# Patient Record
Sex: Female | Born: 2000 | Race: Black or African American | Hispanic: No | Marital: Single | Smoking: Never smoker
Health system: Southern US, Community
[De-identification: ages and names within clinical notes are randomized; demographics above are authoritative.]

## PROBLEM LIST (undated history)

## (undated) DIAGNOSIS — Z789 Other specified health status: Secondary | ICD-10-CM

## (undated) HISTORY — PX: NO PAST SURGERIES: SHX2092

---

## 2021-07-03 ENCOUNTER — Inpatient Hospital Stay (HOSPITAL_COMMUNITY): Payer: Managed Care, Other (non HMO)

## 2021-07-03 ENCOUNTER — Inpatient Hospital Stay (HOSPITAL_COMMUNITY)
Admission: AD | Admit: 2021-07-03 | Discharge: 2021-07-03 | Disposition: A | Discharge status: Home / Self Care | Payer: Managed Care, Other (non HMO) | Attending: Obstetrics & Gynecology | Admitting: Obstetrics & Gynecology

## 2021-07-03 ENCOUNTER — Encounter (HOSPITAL_COMMUNITY): Payer: Self-pay | Admitting: Obstetrics & Gynecology

## 2021-07-03 DIAGNOSIS — R109 Unspecified abdominal pain: Secondary | ICD-10-CM | POA: Diagnosis not present

## 2021-07-03 DIAGNOSIS — O209 Hemorrhage in early pregnancy, unspecified: Secondary | ICD-10-CM

## 2021-07-03 DIAGNOSIS — O26891 Other specified pregnancy related conditions, first trimester: Secondary | ICD-10-CM | POA: Insufficient documentation

## 2021-07-03 DIAGNOSIS — O3680X0 Pregnancy with inconclusive fetal viability, not applicable or unspecified: Secondary | ICD-10-CM | POA: Insufficient documentation

## 2021-07-03 DIAGNOSIS — O200 Threatened abortion: Secondary | ICD-10-CM | POA: Diagnosis not present

## 2021-07-03 DIAGNOSIS — Z3A01 Less than 8 weeks gestation of pregnancy: Secondary | ICD-10-CM | POA: Insufficient documentation

## 2021-07-03 HISTORY — DX: Other specified health status: Z78.9

## 2021-07-03 LAB — CBC
HCT: 36.7 % (ref 36.0–46.0)
Hemoglobin: 11.4 g/dL — ABNORMAL LOW (ref 12.0–15.0)
MCH: 26.6 pg (ref 26.0–34.0)
MCHC: 31.1 g/dL (ref 30.0–36.0)
MCV: 85.7 fL (ref 80.0–100.0)
Platelets: 360 10*3/uL (ref 150–400)
RBC: 4.28 MIL/uL (ref 3.87–5.11)
RDW: 12.1 % (ref 11.5–15.5)
WBC: 7.9 10*3/uL (ref 4.0–10.5)
nRBC: 0 % (ref 0.0–0.2)

## 2021-07-03 LAB — URINALYSIS, ROUTINE W REFLEX MICROSCOPIC
Bacteria, UA: NONE SEEN
Bilirubin Urine: NEGATIVE
Glucose, UA: NEGATIVE mg/dL
Ketones, ur: 5 mg/dL — AB
Leukocytes,Ua: NEGATIVE
Nitrite: NEGATIVE
Protein, ur: NEGATIVE mg/dL
Specific Gravity, Urine: 1.021 (ref 1.005–1.030)
pH: 5 (ref 5.0–8.0)

## 2021-07-03 LAB — WET PREP, GENITAL
Clue Cells Wet Prep HPF POC: NONE SEEN
Sperm: NONE SEEN
Trich, Wet Prep: NONE SEEN
Yeast Wet Prep HPF POC: NONE SEEN

## 2021-07-03 LAB — POCT PREGNANCY, URINE: Preg Test, Ur: POSITIVE — AB

## 2021-07-03 LAB — HCG, QUANTITATIVE, PREGNANCY: hCG, Beta Chain, Quant, S: 1998 m[IU]/mL — ABNORMAL HIGH (ref <5)

## 2021-07-03 NOTE — MAU Provider Note (Signed)
History     CSN: [id number]  Arrival date and time: 07/03/21 1509   Event Date/Time   First Provider Initiated Contact with Patient 07/03/21 1553      Chief Complaint  Patient presents with  . Vaginal Bleeding  . Abdominal Pain  . Possible Pregnancy   HPI Carmen Cook is a 20 y.o. G1P0 at [redacted]w[redacted]d who presents with vaginal bleeding & abdominal cramping. Symptoms started this morning. Was initially brown spotting but is now more red & bleeding into a pad. Passing some small clots. Rates abdominal cramping 6/10. Has been taking tylenol with minimal relief. Pain is throughout her lower abdomen. Does not have an ob/gyn. Denies abnormal discharge, n/v/d, fever, dysuria.   OB History     Gravida  1   Para      Term      Preterm      AB      Living         SAB      IAB      Ectopic      Multiple      Live Births              Past Medical History:  Diagnosis Date  . No pertinent past medical history     Past Surgical History:  Procedure Laterality Date  . NO PAST SURGERIES      No family history on file.     Allergies: Not on File  No medications prior to admission.    Review of Systems  Constitutional: Negative.   Gastrointestinal:  Positive for abdominal pain.  Genitourinary:  Positive for vaginal bleeding.  Physical Exam   Blood pressure 117/69, pulse 80, temperature 98.5 F (36.9 C), temperature source Oral, resp. rate 17, height 5\' 6"  (1.676 m), weight 66 kg, last menstrual period 05/16/2021, SpO2 100 %.  Physical Exam Vitals and nursing note reviewed.  Constitutional:      General: She is not in acute distress.    Appearance: She is well-developed.  HENT:     Head: Normocephalic and atraumatic.  Eyes:     General: No scleral icterus. Pulmonary:     Effort: Pulmonary effort is normal. No respiratory distress.  Abdominal:     General: Abdomen is flat.     Palpations: Abdomen is soft.     Tenderness: There is no abdominal  tenderness.  Skin:    General: Skin is warm and dry.  Neurological:     General: No focal deficit present.     Mental Status: She is alert.  Psychiatric:        Mood and Affect: Mood normal.        Behavior: Behavior normal.    MAU Course  Procedures Results for orders placed or performed during the hospital encounter of 07/03/21 (from the past 24 hour(s))  Pregnancy, urine POC     Status: Abnormal   Collection Time: 07/03/21  3:48 PM  Result Value Ref Range   Preg Test, Ur POSITIVE (A) NEGATIVE  Urinalysis, Routine w reflex microscopic Urine, Clean Catch     Status: Abnormal   Collection Time: 07/03/21  3:51 PM  Result Value Ref Range   Color, Urine YELLOW YELLOW   APPearance HAZY (A) CLEAR   Specific Gravity, Urine 1.021 1.005 - 1.030   pH 5.0 5.0 - 8.0   Glucose, UA NEGATIVE NEGATIVE mg/dL   Hgb urine dipstick LARGE (A) NEGATIVE   Bilirubin Urine NEGATIVE NEGATIVE   Ketones,  ur 5 (A) NEGATIVE mg/dL   Protein, ur NEGATIVE NEGATIVE mg/dL   Nitrite NEGATIVE NEGATIVE   Leukocytes,Ua NEGATIVE NEGATIVE   RBC / HPF 11-20 0 - 5 RBC/hpf   WBC, UA 0-5 0 - 5 WBC/hpf   Bacteria, UA NONE SEEN NONE SEEN   Squamous Epithelial / LPF 0-5 0 - 5   Mucus PRESENT   Wet prep, genital     Status: Abnormal   Collection Time: 07/03/21  4:02 PM   Specimen: PATH Cytology Cervicovaginal Ancillary Only  Result Value Ref Range   Yeast Wet Prep HPF POC NONE SEEN NONE SEEN   Trich, Wet Prep NONE SEEN NONE SEEN   Clue Cells Wet Prep HPF POC NONE SEEN NONE SEEN   WBC, Wet Prep HPF POC MANY (A) NONE SEEN   Sperm NONE SEEN   CBC     Status: Abnormal   Collection Time: 07/03/21  4:22 PM  Result Value Ref Range   WBC 7.9 4.0 - 10.5 K/uL   RBC 4.28 3.87 - 5.11 MIL/uL   Hemoglobin 11.4 (L) 12.0 - 15.0 g/dL   HCT 06.2 69.4 - 85.4 %   MCV 85.7 80.0 - 100.0 fL   MCH 26.6 26.0 - 34.0 pg   MCHC 31.1 30.0 - 36.0 g/dL   RDW 62.7 03.5 - 00.9 %   Platelets 360 150 - 400 K/uL   nRBC 0.0 0.0 - 0.2 %   ABO/Rh     Status: None   Collection Time: 07/03/21  4:22 PM  Result Value Ref Range   ABO/RH(D)      A POS Performed at [facility], [address], [place], [place] [address]   hCG, quantitative, pregnancy     Status: Abnormal   Collection Time: 07/03/21  4:22 PM  Result Value Ref Range   hCG, Beta Chain, Quant, S 1,998 (H) <5 mIU/mL   US OB LESS THAN 14 WEEKS WITH OB TRANSVAGINAL  Result Date: 07/03/2021 CLINICAL DATA:  20 year old female with positive urine pregnancy test and pelvic pain and vaginal bleeding for several days. EXAM: OBSTETRIC <14 WK [place] AND TRANSVAGINAL OB US TECHNIQUE: Both transabdominal and transvaginal ultrasound examinations were performed for complete evaluation of the gestation as well as the maternal uterus, adnexal regions, and pelvic cul-de-sac. Transvaginal technique was performed to assess early pregnancy. COMPARISON:  None. FINDINGS: Intrauterine gestational sac: None Yolk sac:  Not Visualized. Embryo:  Not Visualized. Maternal uterus/adnexae: Endometrium measures 12 mm. The uterus and ovaries are unremarkable. There is no evidence of free fluid or adnexal mass. IMPRESSION: No IUP or adnexal mass visualized. Differential diagnosis includes recent spontaneous abortion, IUP too early to visualize, and occult ectopic pregnancy. Recommend close follow up of quantitative B-HCG levels, and follow up US as clinically warranted. Electronically Signed   By: Harmon Pier M.D.   On: 07/03/2021 16:57    MDM +UPT UA, wet prep, GC/chlamydia, CBC, ABO/Rh, quant hCG, and [place] today to rule out ectopic pregnancy which can be life threatening.   Patient presents with cramping & bleeding. Abdomen soft & non tender. RH positive.  Ultrasound shows no IUP or adnexal mass. HCG is 1998. Reviewed with Dr. Adrian Blackwater - pt ok for f/u HCG.   Assessment and Plan   1. Pregnancy of unknown anatomic location   2. Vaginal bleeding in pregnancy, first trimester   3. Threatened  miscarriage   4. [redacted] weeks gestation of pregnancy    -Scheduled for stat HCG on Thursday  at [facility] -reviewed SAB & ectopic precautions & reasons to return to MAU -GC/CT pending -pelvic rest  Judeth Horn 07/03/2021, 6:46 PM

## 2021-07-03 NOTE — Discharge Instructions (Signed)
Return to care  If you have heavier bleeding that soaks through more than 2 pads per hour for an hour or more If you bleed so much that you feel like you might pass out or you do pass out If you have significant abdominal pain that is not improved with Tylenol   

## 2021-07-03 NOTE — MAU Note (Signed)
+  HPT 2 wks.  Has been in pain since she found out she was preg.  Pain got worse on Sat, worst pain ever.  Started spotting on Saturday, has  increased.  Has changed pad 4 times today.  Reports LMP end of Sept, had some spotting end of Oct.

## 2021-07-04 LAB — ABO/RH: ABO/RH(D): A POS

## 2021-07-04 LAB — GC/CHLAMYDIA PROBE AMP (~~LOC~~) NOT AT ARMC
Chlamydia: NEGATIVE
Comment: NEGATIVE
Comment: NORMAL
Neisseria Gonorrhea: NEGATIVE

## 2021-07-06 ENCOUNTER — Ambulatory Visit (INDEPENDENT_AMBULATORY_CARE_PROVIDER_SITE_OTHER): Payer: Managed Care, Other (non HMO)

## 2021-07-06 ENCOUNTER — Other Ambulatory Visit: Payer: Self-pay

## 2021-07-06 VITALS — BP 122/72 | HR 76 | Ht 66.0 in | Wt 141.8 lb

## 2021-07-06 DIAGNOSIS — O209 Hemorrhage in early pregnancy, unspecified: Secondary | ICD-10-CM

## 2021-07-06 LAB — BETA HCG QUANT (REF LAB): hCG Quant: 299 m[IU]/mL

## 2021-07-06 NOTE — Progress Notes (Signed)
Catalina Antigua, MD  07/06/2021  2:08 PM EST Back to Top    Please inform patient of decreasing quant HCG consistent with miscarriage. Please have her follow up with a provider for repeat quant HCG and family planning   Thanks   Peggy   Call placed to pt.  Spoke with pt. Pt given results and recommendations per Dr Jolayne Panther. Pt verbalized understanding. Pt has follow up appt on 12/5 and encouraged pt to keep to follow up on SAB and beta quat. Pt agreeable to plan of care.  Judeth Cornfield, RN

## 2021-07-06 NOTE — Progress Notes (Signed)
Pt here today for STAT Beta from MAU follow up on 07/03/21.  Pt states having vaginal bleeding, pain or cramps as the same as Saturday when went to ER for evaluation. LMP 05/10/21. Pt states changing pad every 2 hours. Denies soaking, but enough to change the pad. Has taken OTC Tylenol 500mg  every 6 hours  for pain that helps, but doesn't resolve.  Pt advised after results come back from Beta, will be contacted via phone with plan of care per Dr . Pt verbalized understanding.    Jolayne Panther, RN

## 2021-07-24 ENCOUNTER — Ambulatory Visit (INDEPENDENT_AMBULATORY_CARE_PROVIDER_SITE_OTHER): Payer: Managed Care, Other (non HMO)

## 2021-07-24 ENCOUNTER — Other Ambulatory Visit: Payer: Self-pay

## 2021-07-24 ENCOUNTER — Other Ambulatory Visit (HOSPITAL_COMMUNITY)
Admission: RE | Admit: 2021-07-24 | Discharge: 2021-07-24 | Disposition: A | Discharge status: Home / Self Care | Payer: Managed Care, Other (non HMO) | Source: Ambulatory Visit

## 2021-07-24 VITALS — BP 113/68 | HR 94 | Wt 135.9 lb

## 2021-07-24 DIAGNOSIS — R4589 Other symptoms and signs involving emotional state: Secondary | ICD-10-CM | POA: Diagnosis not present

## 2021-07-24 DIAGNOSIS — O039 Complete or unspecified spontaneous abortion without complication: Secondary | ICD-10-CM | POA: Diagnosis not present

## 2021-07-24 DIAGNOSIS — Z5941 Food insecurity: Secondary | ICD-10-CM

## 2021-07-24 DIAGNOSIS — N898 Other specified noninflammatory disorders of vagina: Secondary | ICD-10-CM

## 2021-07-24 DIAGNOSIS — Z3202 Encounter for pregnancy test, result negative: Secondary | ICD-10-CM | POA: Diagnosis not present

## 2021-07-24 DIAGNOSIS — R102 Pelvic and perineal pain: Secondary | ICD-10-CM | POA: Diagnosis not present

## 2021-07-24 LAB — POCT PREGNANCY, URINE: Preg Test, Ur: NEGATIVE

## 2021-07-24 NOTE — Progress Notes (Signed)
   GYNECOLOGY PROGRESS NOTE  History:  20 y.o. G1P0 presents to [facility] office today for follow up after SAB. She reports intermittent LLQ pain. Pain feels similar to the time she had a cyst. Denies bleeding or urinary s/s, but does report vaginal discharge with a slight odor. Mood is overall stable but wishes to speak to someone regarding SAB. She declines contraception.   The following portions of the patient's history were reviewed and updated as appropriate: allergies, current medications, past family history, past medical history, past social history, past surgical history and problem list. She has never had a pap smear.   Health Maintenance Due  Topic Date Due   COVID-19 Vaccine (1) Never done   HPV VACCINES (1 - 2-dose series) Never done   HIV Screening  Never done   Hepatitis C Screening  Never done   TETANUS/TDAP  Never done   PAP-Cervical Cytology Screening  Never done   PAP SMEAR-Modifier  Never done     Review of Systems:  Pertinent items are noted in HPI.   Objective:  Physical Exam Last menstrual period 05/10/2021, unknown if currently breastfeeding. VS reviewed, nursing note reviewed,  Constitutional: well developed, well nourished, no distress HEENT: normocephalic CV: normal rate Pulm/chest wall: normal effort Breast Exam: deferred Abdomen: soft Neuro: alert and oriented x 3 Skin: warm, dry Psych: affect normal Pelvic exam: patient self swabbed   Assessment & Plan:  1. Food insecurity  - AMBULATORY REFERRAL TO [facility]  2. Sad mood  - Ambulatory referral to [facility]  3. Pelvic pain - LLQ pain similar to previous pain d/t ovarian cyst  - US Pelvis Complete; Future  4. Discharge from the vagina - Patient self swabbed  - Cervicovaginal ancillary only( [place])  5. Miscarriage - UPT negative today - Patient was offered pap smear today but declines. Discussed importance of getting pap smear starting at age 56 and  patient will schedule appointment for pap smear next month - Patient declines contraception today    Brand Males, CNM 2:50 PM

## 2021-07-25 NOTE — BH Specialist Note (Signed)
Pt did not arrive to video visit and did not answer the phone; Left HIPPA-compliant message to call back Angelisse Riso from [facility] at  [phone] (Amariss Detamore's office).  ?; left MyChart message for patient.  ? ?

## 2021-07-27 ENCOUNTER — Other Ambulatory Visit: Payer: Self-pay

## 2021-07-27 ENCOUNTER — Ambulatory Visit
Admission: RE | Admit: 2021-07-27 | Discharge: 2021-07-27 | Disposition: A | Discharge status: Home / Self Care | Payer: Managed Care, Other (non HMO) | Source: Ambulatory Visit

## 2021-07-27 DIAGNOSIS — R102 Pelvic and perineal pain: Secondary | ICD-10-CM | POA: Diagnosis present

## 2021-07-28 LAB — CERVICOVAGINAL ANCILLARY ONLY
Bacterial Vaginitis (gardnerella): POSITIVE — AB
Candida Glabrata: NEGATIVE
Candida Vaginitis: NEGATIVE
Chlamydia: NEGATIVE
Comment: NEGATIVE
Comment: NEGATIVE
Comment: NEGATIVE
Comment: NEGATIVE
Comment: NEGATIVE
Comment: NORMAL
Neisseria Gonorrhea: NEGATIVE
Trichomonas: NEGATIVE

## 2021-07-31 ENCOUNTER — Telehealth: Payer: Self-pay

## 2021-07-31 MED ORDER — METRONIDAZOLE 500 MG PO TABS: 500.0000 mg | ORAL_TABLET | Freq: Two times a day (BID) | ORAL | 0 refills | Status: DC

## 2021-07-31 NOTE — Telephone Encounter (Addendum)
-----   Message from Brand Males, CNM sent at 07/28/2021  3:50 PM EST ----- Please send metrogel to patient's pharmacy. Patient has BV  Pt notified of results and treatment.    Addison Naegeli, RN  07/31/21

## 2021-08-07 ENCOUNTER — Ambulatory Visit: Payer: Managed Care, Other (non HMO) | Admitting: Clinical

## 2021-08-07 DIAGNOSIS — Z91199 Patient's noncompliance with other medical treatment and regimen due to unspecified reason: Secondary | ICD-10-CM

## 2021-09-07 ENCOUNTER — Other Ambulatory Visit (HOSPITAL_COMMUNITY)
Admission: RE | Admit: 2021-09-07 | Discharge: 2021-09-07 | Disposition: A | Discharge status: Home / Self Care | Payer: Managed Care, Other (non HMO) | Source: Ambulatory Visit

## 2021-09-07 ENCOUNTER — Ambulatory Visit (INDEPENDENT_AMBULATORY_CARE_PROVIDER_SITE_OTHER): Payer: Managed Care, Other (non HMO)

## 2021-09-07 ENCOUNTER — Other Ambulatory Visit: Payer: Self-pay

## 2021-09-07 VITALS — BP 112/77 | HR 89 | Ht 65.0 in | Wt 138.0 lb

## 2021-09-07 DIAGNOSIS — Z124 Encounter for screening for malignant neoplasm of cervix: Secondary | ICD-10-CM | POA: Diagnosis present

## 2021-09-07 DIAGNOSIS — N898 Other specified noninflammatory disorders of vagina: Secondary | ICD-10-CM

## 2021-09-07 DIAGNOSIS — Z01419 Encounter for gynecological examination (general) (routine) without abnormal findings: Secondary | ICD-10-CM

## 2021-09-07 DIAGNOSIS — F32A Depression, unspecified: Secondary | ICD-10-CM

## 2021-09-07 LAB — POCT PREGNANCY, URINE: Preg Test, Ur: NEGATIVE

## 2021-09-07 NOTE — Progress Notes (Signed)
Subjective:     Carmen Cook is a 21 y.o. female here at [facility] for a routine exam. Current complaints: has not had period since SAB in November. Gets cramping as if she is going to start, but never does. She also reports that she often feels sad/depressed since SAB. She has not discussed this with anyone. Denies SI. She is sexually active with 1 female partner, is not on birth control and is not interested in any. Personal health questionnaire reviewed: yes.  Do you have a primary care provider? no Do you feel safe at home? yes  [employer] Visit from 07/24/2021 in [facility] [employer] [facility]  PHQ-2 Total Score 5       Health Maintenance Due  Topic Date Due   COVID-19 Vaccine (1) Never done   HPV VACCINES (1 - 2-dose series) Never done   HIV Screening  Never done   Hepatitis C Screening  Never done   TETANUS/TDAP  Never done   PAP-Cervical Cytology Screening  Never done   PAP SMEAR-Modifier  Never done    Risk factors for chronic health problems: Smoking: No Alchohol/how much: Occasional Illicit drug use: No Exercise: No Pt BMI: Body mass index is 22.96 kg/m.   Gynecologic History Patient's last menstrual period was 05/10/2021 (exact date). Contraception: none Last Pap: n/a d/t age Last mammogram: n/a d/t age.  Obstetric History OB History  Gravida Para Term Preterm AB Living  1            SAB IAB Ectopic Multiple Live Births               # Outcome Date GA Lbr Len/2nd Weight Sex Delivery Anes PTL Lv  1 Gravida              The following portions of the patient's history were reviewed and updated as appropriate: allergies, current medications, past family history, past medical history, past social history, past surgical history, and problem list.  Review of Systems Pertinent items are noted in HPI.    Objective:   BP 112/77    Pulse 89    Ht 5\' 5"  (1.651 m)    Wt 138 lb (62.6 kg)    LMP 05/10/2021 (Exact  Date)    Breastfeeding No    BMI 22.96 kg/m  VS reviewed, nursing note reviewed,  Constitutional: well developed, well nourished, no distress HEENT: normocephalic CV: normal rate Pulm/chest wall: normal effort Breast Exam: Deferred with low risks and shared decision making, discussed recommendation to start mammogram between 40-50 yo/ exam Abdomen: soft Neuro: alert and oriented x 3 Skin: warm, dry Psych: affect normal Pelvic exam: Performed: Cervix pink, visually closed, without lesion, scant white creamy discharge, vaginal walls and external genitalia normal, pap smear obtained Bimanual exam: Cervix 0/long/high, firm, anterior, neg CMT, uterus nontender, nonenlarged, adnexa without tenderness, enlargement, or mass      Assessment/Plan:   1. Cervical cancer screening  - Cytology - PAP( [place])  2. Vaginal discharge - Requesting STD screening  - Cervicovaginal ancillary only( [place])  3. Well woman exam with routine gynecological exam - Normal well woman exam - Offered contraception, patient declines  4. Depression, unspecified depression type - Reports feeling sad/depressed since she had SAB in November; no SI - Requesting referral to [facility]  - Ambulatory referral to Integrated [facility]al [facility]    Follow up in: 1  year for well woman visit  or as  needed.   Brand Males, CNM 09/08/21 11:06 AM

## 2021-09-08 ENCOUNTER — Other Ambulatory Visit: Payer: Self-pay

## 2021-09-08 DIAGNOSIS — B3731 Acute candidiasis of vulva and vagina: Secondary | ICD-10-CM

## 2021-09-08 DIAGNOSIS — B9689 Other specified bacterial agents as the cause of diseases classified elsewhere: Secondary | ICD-10-CM

## 2021-09-08 DIAGNOSIS — N760 Acute vaginitis: Secondary | ICD-10-CM

## 2021-09-08 LAB — CYTOLOGY - PAP
Comment: NEGATIVE
Diagnosis: NEGATIVE
High risk HPV: NEGATIVE

## 2021-09-08 LAB — CERVICOVAGINAL ANCILLARY ONLY
Bacterial Vaginitis (gardnerella): POSITIVE — AB
Candida Glabrata: NEGATIVE
Candida Vaginitis: POSITIVE — AB
Chlamydia: NEGATIVE
Comment: NEGATIVE
Comment: NEGATIVE
Comment: NEGATIVE
Comment: NEGATIVE
Comment: NEGATIVE
Comment: NORMAL
Neisseria Gonorrhea: NEGATIVE
Trichomonas: NEGATIVE

## 2021-09-12 NOTE — BH Specialist Note (Signed)
Pt did not arrive to video visit and did not answer the phone; Unable to leave voice message as mailbox is full; left MyChart message for patient.   

## 2021-09-15 MED ORDER — METRONIDAZOLE 500 MG PO TABS: 500.0000 mg | ORAL_TABLET | Freq: Two times a day (BID) | ORAL | 0 refills | Status: DC

## 2021-09-15 MED ORDER — FLUCONAZOLE 150 MG PO TABS: 150.0000 mg | ORAL_TABLET | Freq: Once | ORAL | 0 refills | Status: AC

## 2021-09-25 ENCOUNTER — Other Ambulatory Visit: Payer: Self-pay

## 2021-09-25 ENCOUNTER — Ambulatory Visit: Payer: Medicaid Other | Admitting: Clinical

## 2021-09-25 ENCOUNTER — Telehealth: Payer: Self-pay | Admitting: Lactation Services

## 2021-09-25 DIAGNOSIS — Z91199 Patient's noncompliance with other medical treatment and regimen due to unspecified reason: Secondary | ICD-10-CM

## 2021-09-25 MED ORDER — METRONIDAZOLE 0.75 % VA GEL: 1.0000 | Freq: Every day | VAGINAL | 0 refills | Status: AC

## 2021-09-25 NOTE — Telephone Encounter (Signed)
Patient called in with concerns with medication. She reports she was treated with Flagyl in December and had a lot of headaches, could not stand up felt poorly while taking. She was prescribed Flagyl again in January after Vaginal swab showed + yeast and BV. She has not picked up not taken the medication as the initial course caused her to not want to take the second round. She reports she has been trying to call the office in the last week, without success.   Offered Metrogel vs asking OB for another type of ATB, patient would like to try the Metrogel. Reviewed I will send a My Chart message once sent. Informed her some insurance companies may have a higher deductible or may not cover it. Patient voiced understanding.   Reviewed taking the Metrogel and when finished take the Diflucan. Patient voiced understanding.

## 2021-10-20 ENCOUNTER — Ambulatory Visit (HOSPITAL_COMMUNITY)
Admission: EM | Admit: 2021-10-20 | Discharge: 2021-10-20 | Disposition: A | Discharge status: Home / Self Care | Payer: Managed Care, Other (non HMO) | Attending: Internal Medicine | Admitting: Internal Medicine

## 2021-10-20 ENCOUNTER — Other Ambulatory Visit: Payer: Self-pay

## 2021-10-20 ENCOUNTER — Encounter (HOSPITAL_COMMUNITY): Payer: Self-pay

## 2021-10-20 DIAGNOSIS — N760 Acute vaginitis: Secondary | ICD-10-CM | POA: Insufficient documentation

## 2021-10-20 MED ORDER — FLUCONAZOLE 150 MG PO TABS: 150.0000 mg | ORAL_TABLET | Freq: Every day | ORAL | 0 refills | Status: AC

## 2021-10-20 NOTE — ED Provider Notes (Signed)
?[facility] ? ? ? ?CSN: [id number] ?Arrival date & time: 10/20/21  1743 ? ? ?  ? ?History   ?Chief Complaint ?No chief complaint on file. ? ? ?HPI ?Carmen Cook is a 21 y.o. female to the urgent care complaining of whitish vaginal discharge of several days duration.  The discharge is thick, white and associated with itching in the vaginal area.  She has scratched to the point where she has irritation in the vaginal area.  No dysuria urgency or frequency.  Patient is sexually active with 1 sexual partner.  She engages in unprotected sexual intercourse.  She was treated for BV and vaginal yeast infection a few weeks ago but the symptoms have recurred.  She has some lower abdominal discomfort with no significant pain.  No constipation or diarrhea.  ? ?HPI ? ?Past Medical History:  ?Diagnosis Date  ? No pertinent past medical history   ? ? ?There are no problems to display for this patient. ? ? ?Past Surgical History:  ?Procedure Laterality Date  ? NO PAST SURGERIES    ? ? ?OB History   ? ? Gravida  ?1  ? Para  ?   ? Term  ?   ? Preterm  ?   ? AB  ?   ? Living  ?   ?  ? ? SAB  ?   ? IAB  ?   ? Ectopic  ?   ? Multiple  ?   ? Live Births  ?   ?   ?  ?  ? ? ? ?Home Medications   ? ?Prior to Admission medications   ?Medication Sig Start Date End Date Taking? Authorizing Provider  ?fluconazole (DIFLUCAN) 150 MG tablet Take 1 tablet (150 mg total) by mouth daily. 10/20/21  Yes Mikal Wisman, Britta Mccreedy, MD  ? ? ?Family History ?No family history on file. ? ?Social History ?Social History  ? ?Tobacco Use  ? Smoking status: Never  ? Smokeless tobacco: Never  ?Substance Use Topics  ? Alcohol use: Yes  ? Drug use: Not Currently  ? ? ? ?Allergies   ?Patient has no known allergies. ? ? ?Review of Systems ?Review of Systems  ?HENT: Negative.    ?Gastrointestinal: Negative.   ?Genitourinary:  Positive for vaginal discharge. Negative for dysuria, frequency, urgency, vaginal bleeding and vaginal pain.  ? ? ?Physical Exam ?Triage  Vital Signs ?ED Triage Vitals  ?Enc Vitals Group  ?   BP 10/20/21 1841 110/74  ?   Pulse Rate 10/20/21 1841 89  ?   Resp 10/20/21 1841 16  ?   Temp 10/20/21 1841 98 ?F (36.7 ?C)  ?   Temp Source 10/20/21 1841 Oral  ?   SpO2 10/20/21 1841 100 %  ?   Weight --   ?   Height --   ?   Head Circumference --   ?   Peak Flow --   ?   Pain Score 10/20/21 1846 0  ?   Pain Loc --   ?   Pain Edu? --   ?   Excl. in GC? --   ? ?No data found. ? ?Updated Vital Signs ?BP 110/74 (BP Location: Left Arm)   Pulse 89   Temp 98 ?F (36.7 ?C) (Oral)   Resp 16   LMP 09/08/2021 (Approximate)   SpO2 100%   Breastfeeding No  ? ?Visual Acuity ?Right Eye Distance:   ?Left Eye Distance:   ?Bilateral Distance:   ? ?  Right Eye Near:   ?Left Eye Near:    ?Bilateral Near:    ? ?Physical Exam ?Vitals reviewed.  ?Cardiovascular:  ?   Rate and Rhythm: Normal rate and regular rhythm.  ?Pulmonary:  ?   Effort: Pulmonary effort is normal.  ?   Breath sounds: Normal breath sounds.  ?Abdominal:  ?   General: Bowel sounds are normal.  ?   Palpations: Abdomen is soft.  ?Musculoskeletal:     ?   General: Normal range of motion.  ?Neurological:  ?   Mental Status: She is alert.  ? ? ? ?UC Treatments / Results  ?Labs ?(all labs ordered are listed, but only abnormal results are displayed) ?Labs Reviewed  ?CERVICOVAGINAL ANCILLARY ONLY  ? ? ?EKG ? ? ?Radiology ?No results found. ? ?Procedures ?Procedures (including critical care time) ? ?Medications Ordered in UC ?Medications - No data to display ? ?Initial Impression / Assessment and Plan / UC Course  ?I have reviewed the triage vital signs and the nursing notes. ? ?Pertinent labs & imaging results that were available during my care of the patient were reviewed by me and considered in my medical decision making (see chart for details). ? ?  ? ?1.  Acute vaginitis: ?Cervical vaginal swab for GC/chlamydia/trichomonas/bacterial vaginosis/vaginal yeast ?Fluconazole 150 mg x 1 dose to be repeated in 72 hours if  symptoms does not improve ?We will call patient with recommendations if labs are abnormal ?Return precautions given. ?Final Clinical Impressions(s) / UC Diagnoses  ? ?Final diagnoses:  ?Acute vaginitis  ? ? ? ?Discharge Instructions   ? ?  ?We will call you with recommendations if labs are abnormal ?Take medications as prescribed ?If symptoms recur please return to urgent care to be reevaluated ?Avoid sexual intercourse until lab results are available. ? ? ? ? ?ED Prescriptions   ? ? Medication Sig Dispense Auth. Provider  ? fluconazole (DIFLUCAN) 150 MG tablet Take 1 tablet (150 mg total) by mouth daily. 2 tablet Evona Westra, Britta Mccreedy, MD  ? ?  ? ?PDMP not reviewed this encounter. ?  ?Merrilee Jansky, MD ?10/20/21 1911 ? ?

## 2021-10-20 NOTE — Discharge Instructions (Addendum)
We will call you with recommendations if labs are abnormal ?Take medications as prescribed ?If symptoms recur please return to urgent care to be reevaluated ?Avoid sexual intercourse until lab results are available. ?

## 2021-10-20 NOTE — ED Triage Notes (Signed)
Pt reports vaginal ongoing vaginal discharge. She thinks she has BV, but would like to be check for all STI. ?

## 2021-10-23 LAB — CERVICOVAGINAL ANCILLARY ONLY
Bacterial Vaginitis (gardnerella): NEGATIVE
Candida Glabrata: NEGATIVE
Candida Vaginitis: POSITIVE — AB
Chlamydia: NEGATIVE
Comment: NEGATIVE
Comment: NEGATIVE
Comment: NEGATIVE
Comment: NEGATIVE
Comment: NEGATIVE
Comment: NORMAL
Neisseria Gonorrhea: NEGATIVE
Trichomonas: NEGATIVE

## 2023-03-25 IMAGING — US US OB < 14 WEEKS - US OB TV
1 series · 15 of 28 positions shown · non-contrast
Comparison: None.

CLINICAL DATA: 20-year-old female with positive urine pregnancy
test and pelvic pain and vaginal bleeding for several days.

EXAM:
OBSTETRIC <14 WK US AND TRANSVAGINAL OB US
TECHNIQUE: Both transabdominal and transvaginal ultrasound examinations were
performed for complete evaluation of the gestation as well as the
maternal uterus, adnexal regions, and pelvic cul-de-sac.
Transvaginal technique was performed to assess early pregnancy.

[Series 1: us ob < 14 weeks - us ob tv · 15 of 50 slices shown]
[im 1/50]
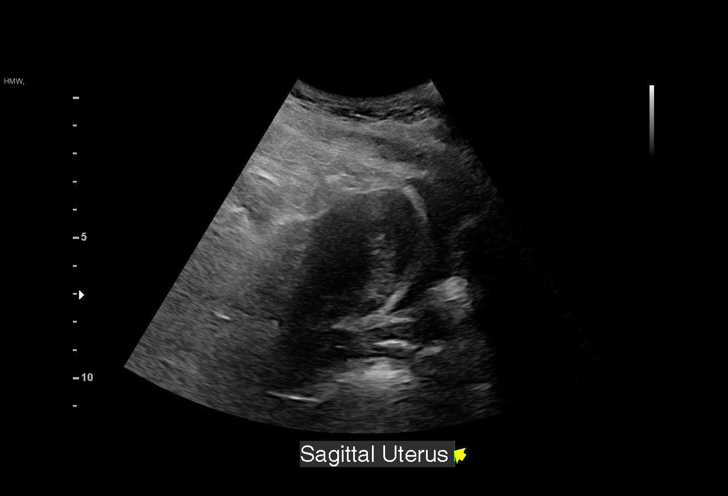
[im 4/50]
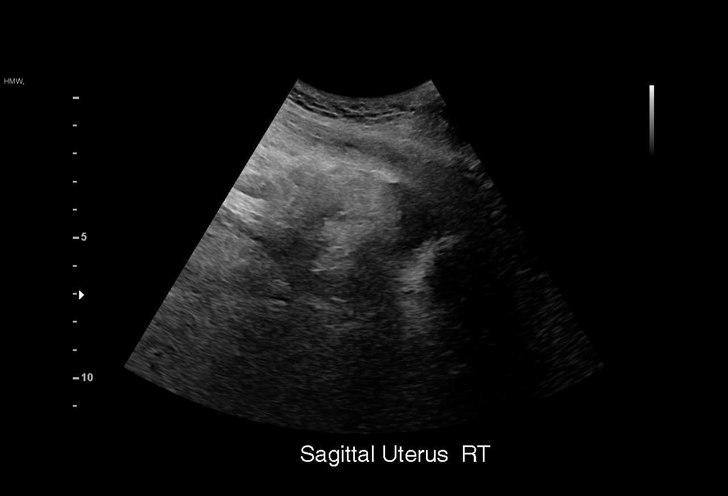
[im 8/50]
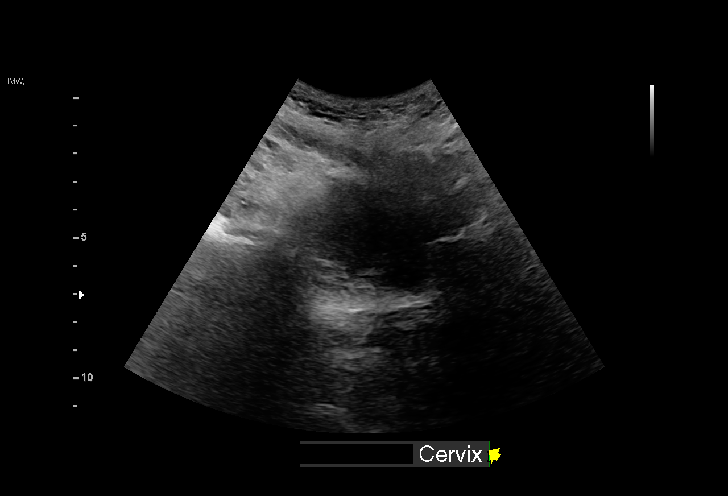
[im 11/50]
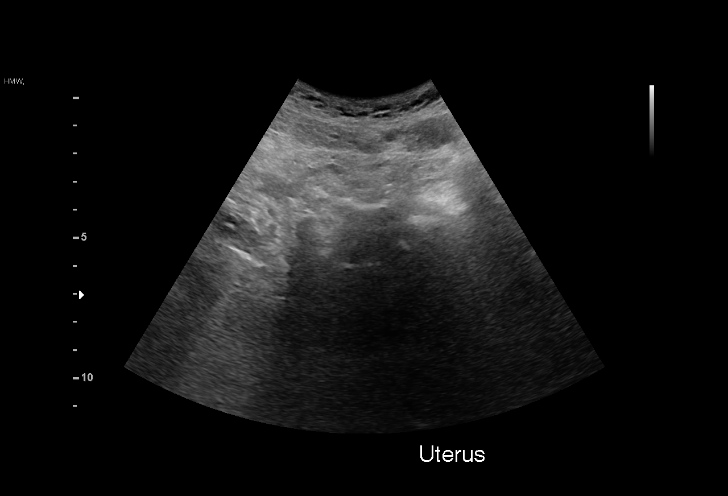
[im 15/50]
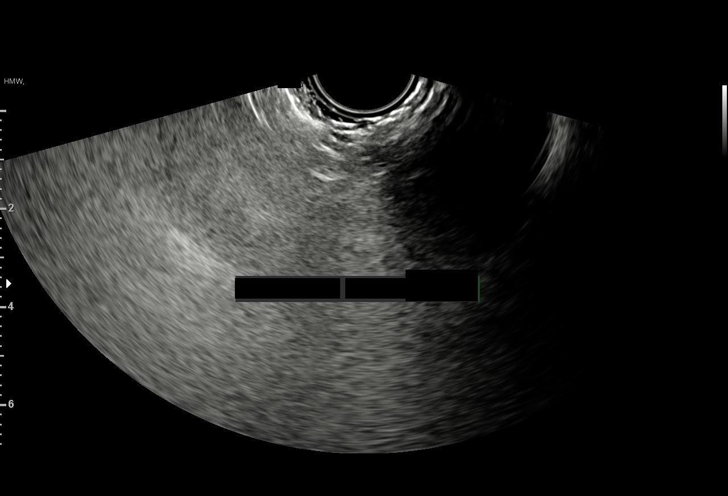
[im 19/50]
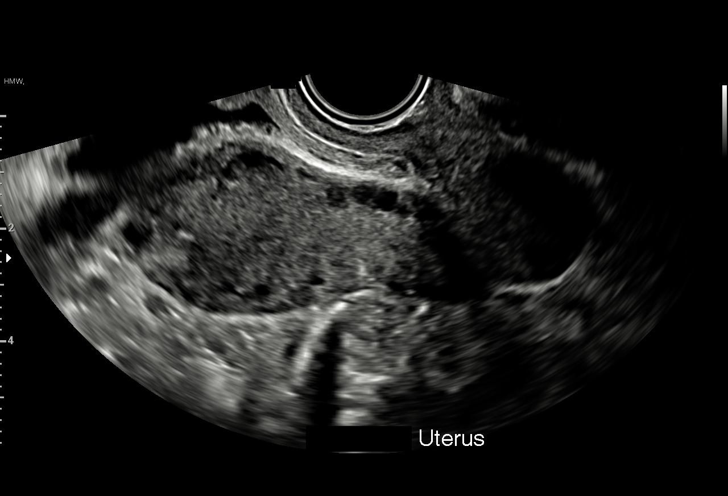
[im 22/50]
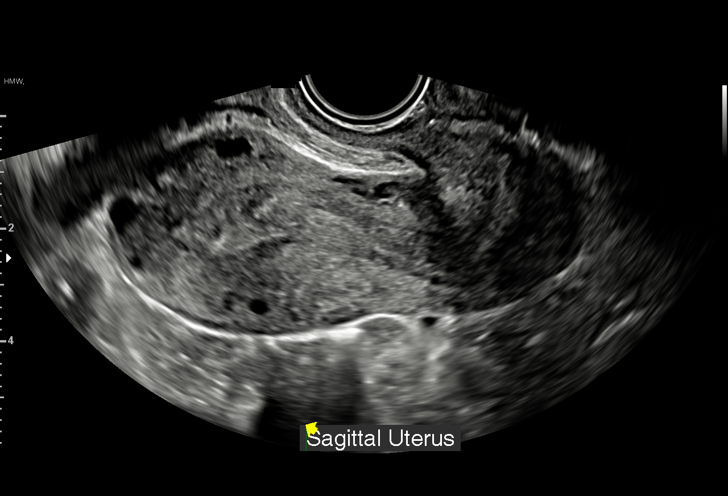
[im 26/50]
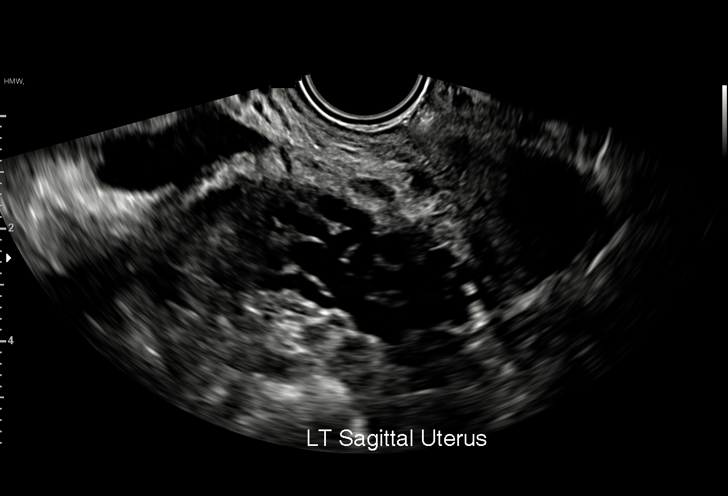
[im 28/50]
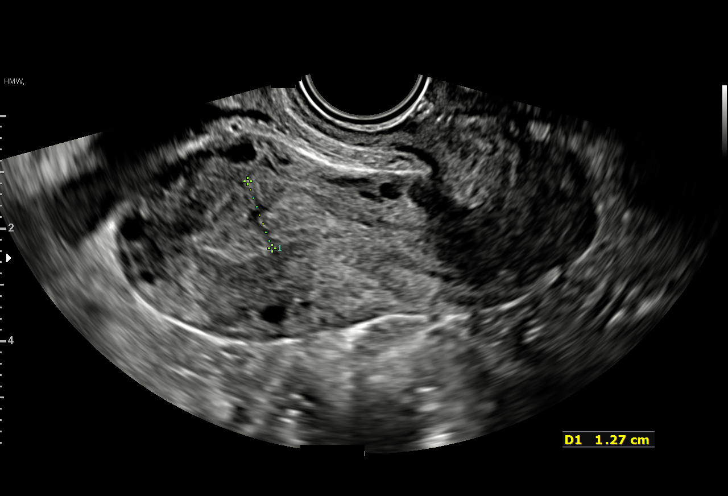
[im 31/50]
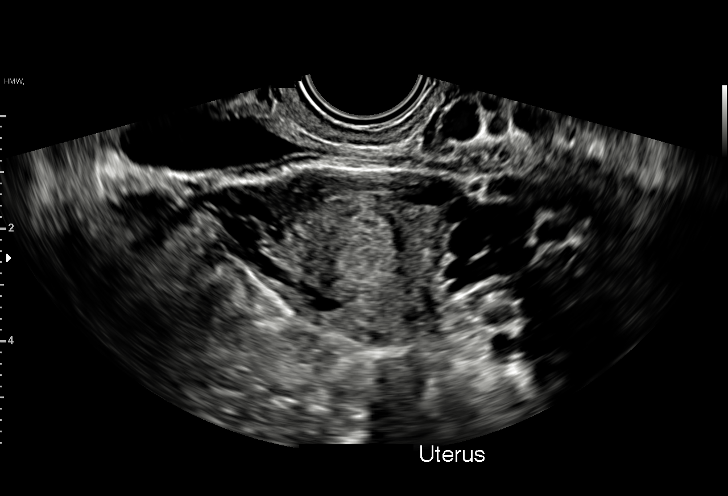
[im 35/50]
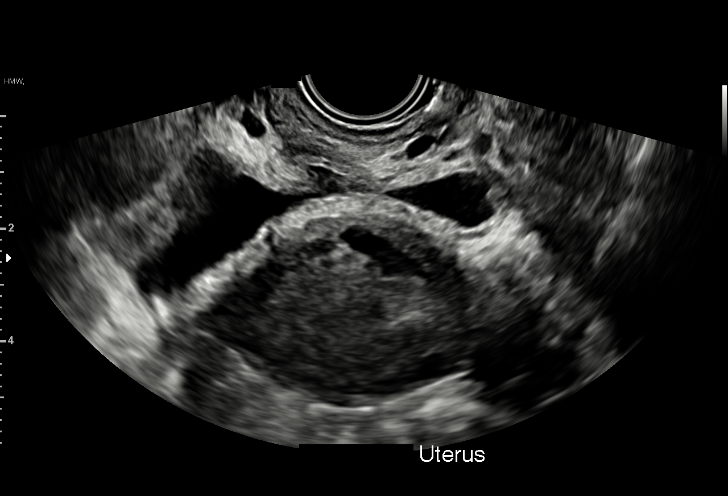
[im 39/50]
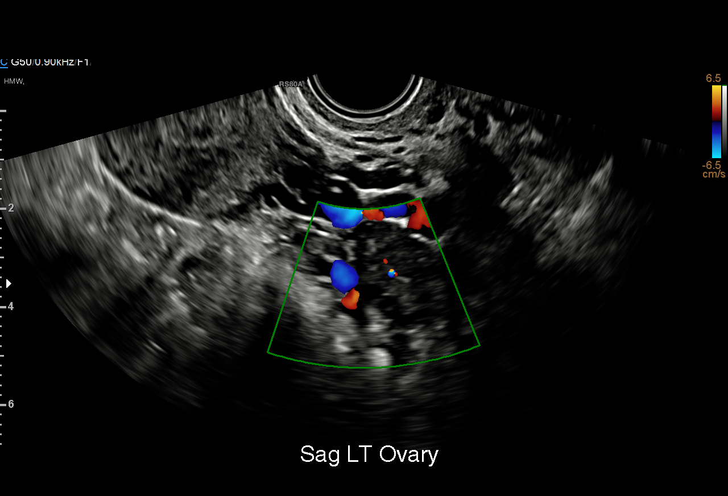
[im 42/50]
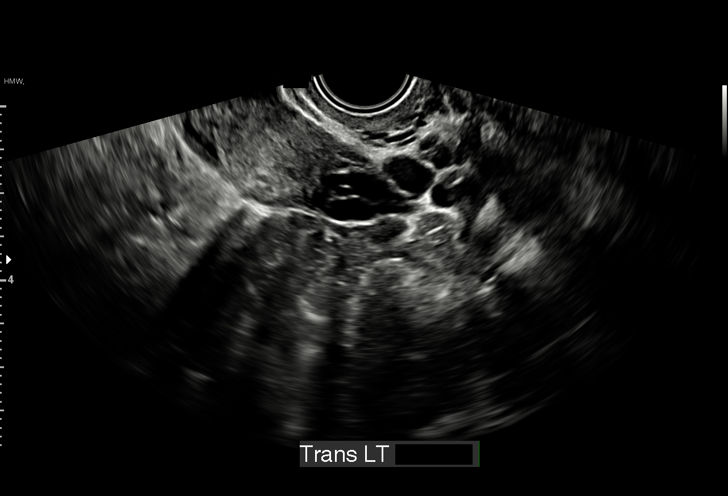
[im 46/50]
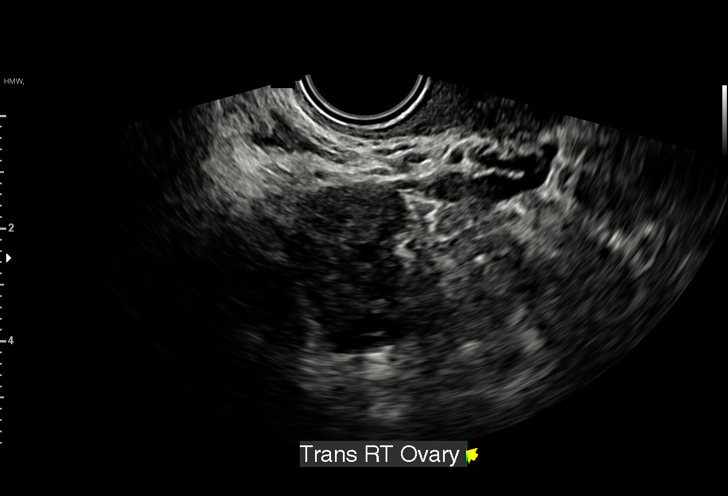
[im 50/50]
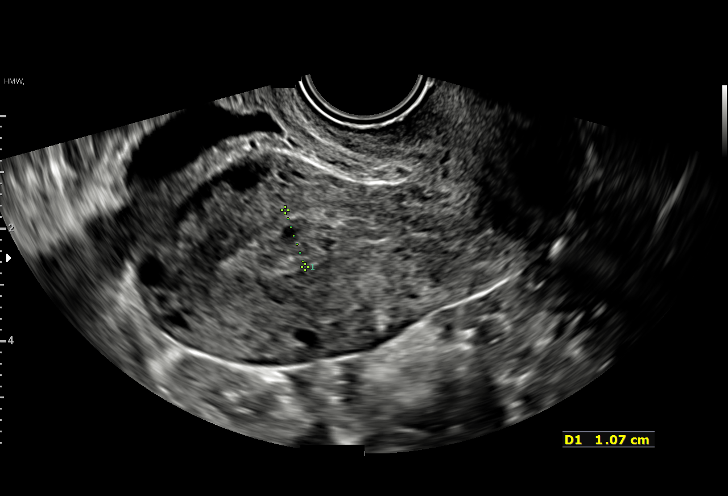

[15 of 28 positions shown; findings below may reference images not displayed]

FINDINGS: Intrauterine gestational sac: None

Yolk sac:  Not Visualized.

Embryo:  Not Visualized.

Maternal uterus/adnexae: Endometrium measures 12 mm.

The uterus and ovaries are unremarkable.

There is no evidence of free fluid or adnexal mass.
IMPRESSION: No IUP or adnexal mass visualized. Differential diagnosis includes
recent spontaneous abortion, IUP too early to visualize, and occult
ectopic pregnancy. Recommend close follow up of quantitative B-HCG
levels, and follow up US as clinically warranted.

## 2023-04-18 IMAGING — US US PELVIS COMPLETE
1 series · 15 of 22 positions shown · non-contrast
Comparison: Prior ultrasound from 07/03/2021

CLINICAL DATA: Initial evaluation for left-sided pelvic pain,
history of SAB in [REDACTED].

EXAM:
TRANSABDOMINAL ULTRASOUND OF PELVIS
TECHNIQUE: Transabdominal ultrasound examination of the pelvis was performed
including evaluation of the uterus, ovaries, adnexal regions, and
pelvic cul-de-sac.

[Series 1: us pelvis complete · 15 of 22 slices shown]
[im 1/22]
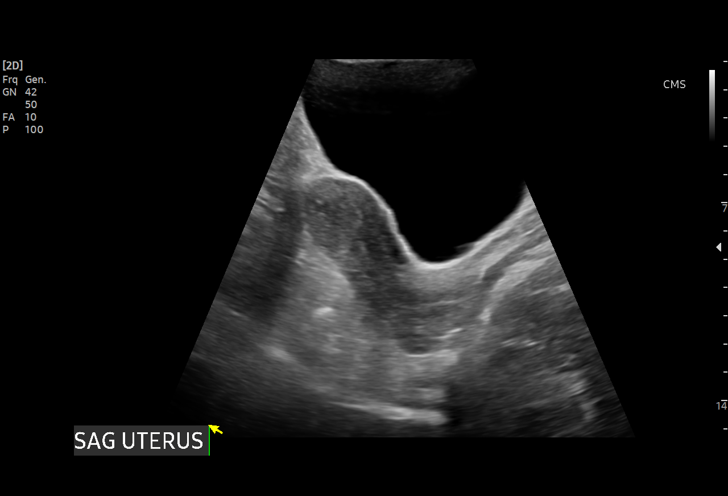
[im 3/22]
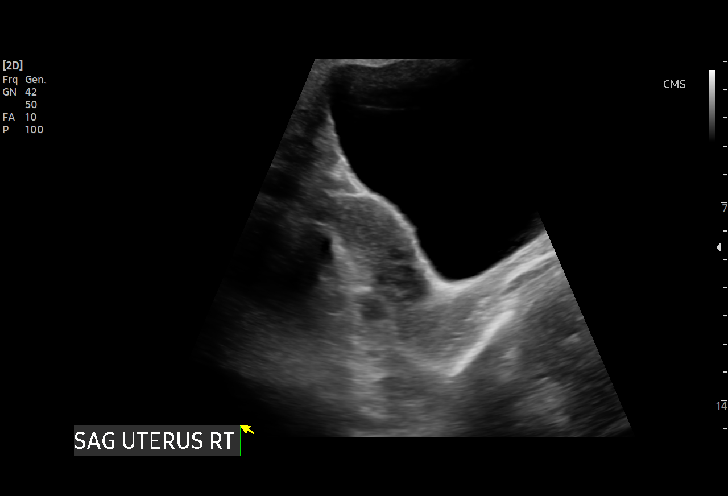
[im 4/22]
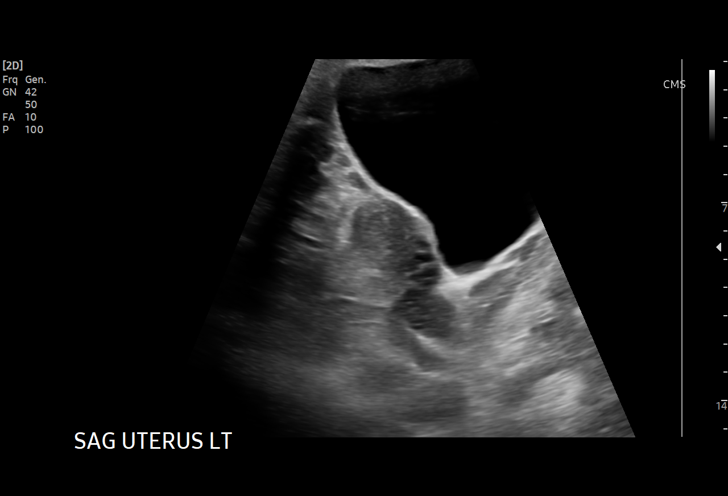
[im 6/22]
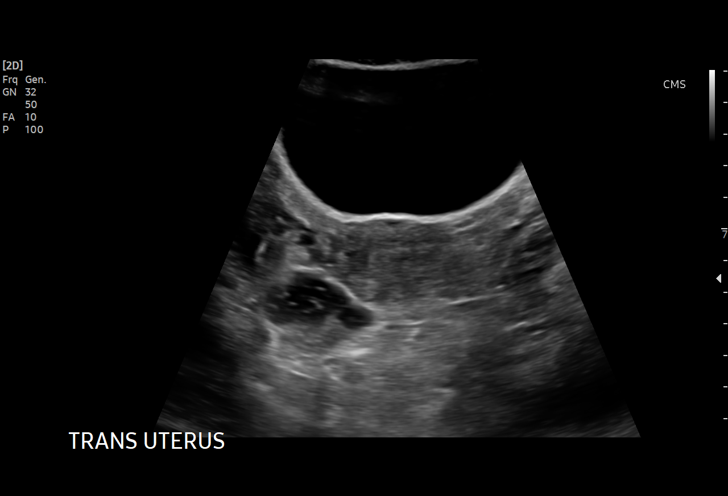
[im 7/22]
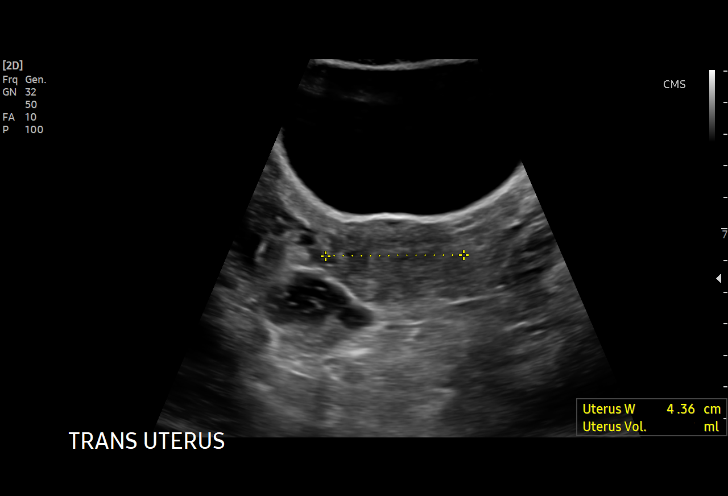
[im 9/22]
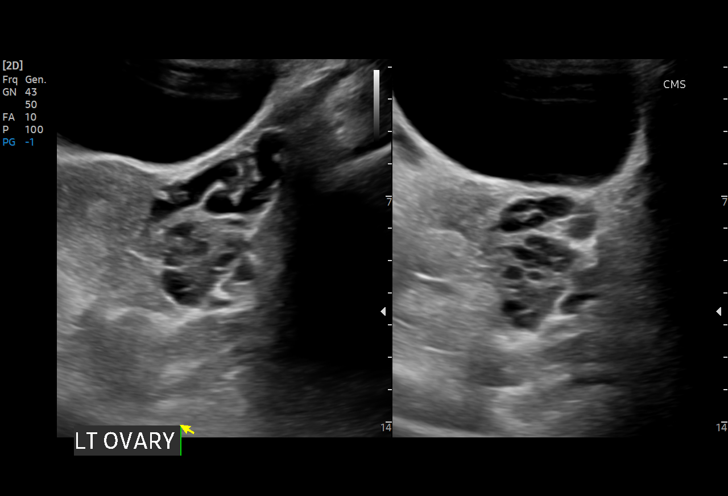
[im 10/22]
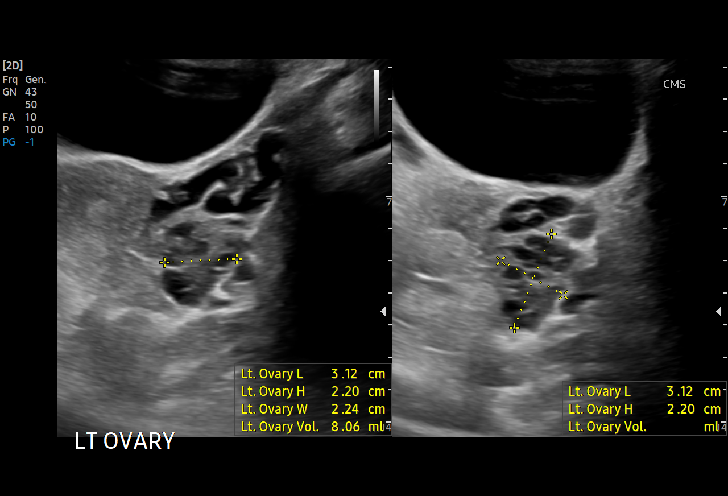
[im 12/22]
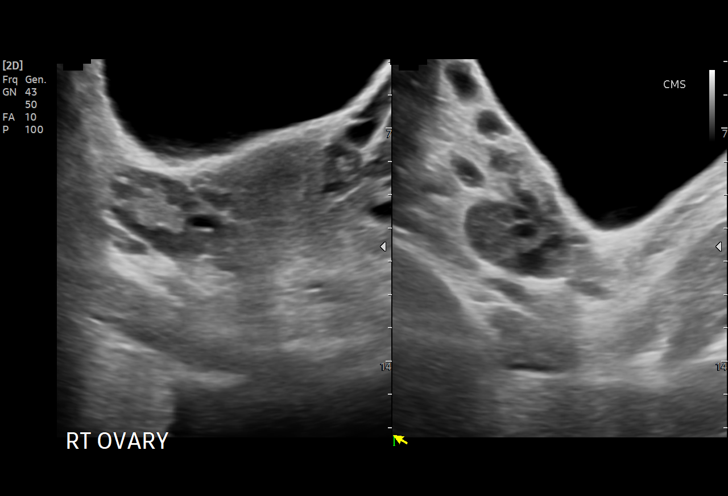
[im 13/22]
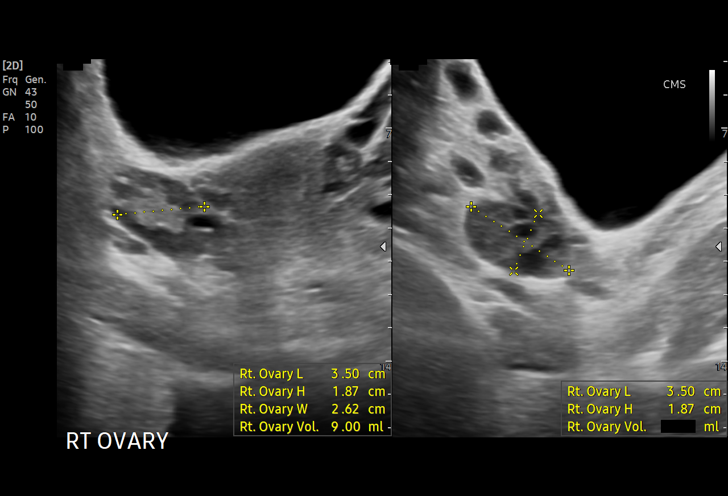
[im 14/22]
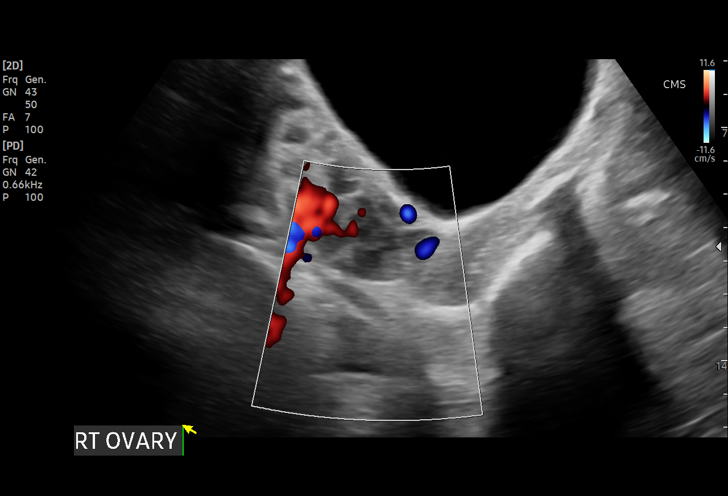
[im 16/22]
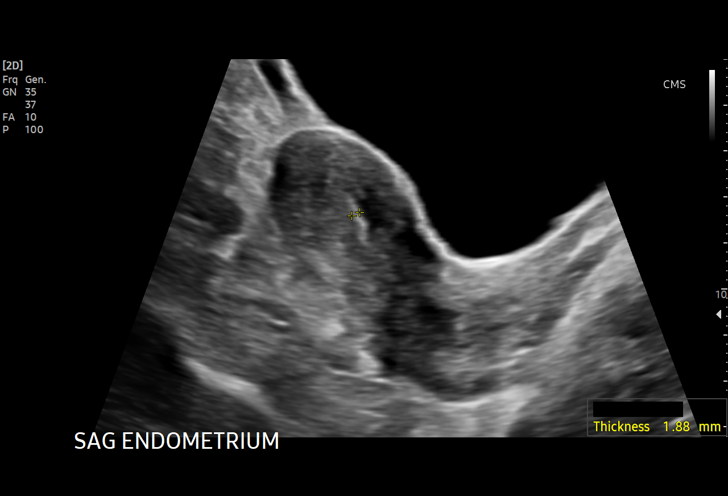
[im 17/22]
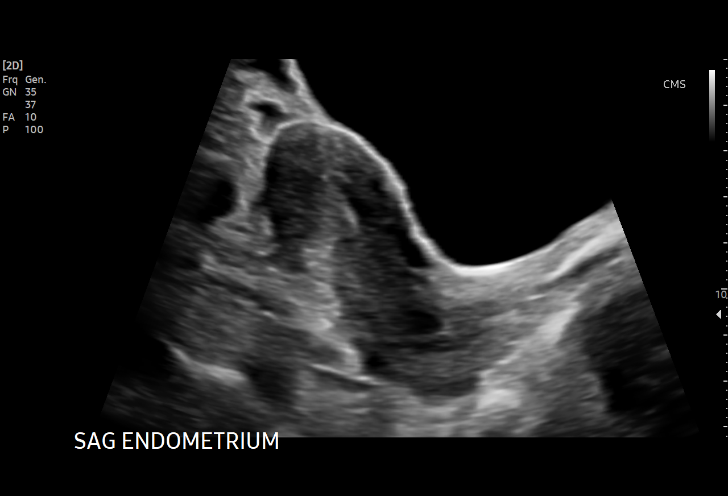
[im 19/22]
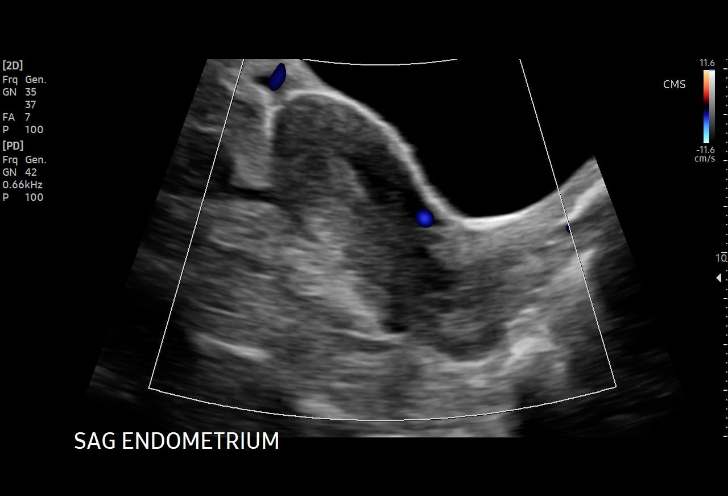
[im 20/22]
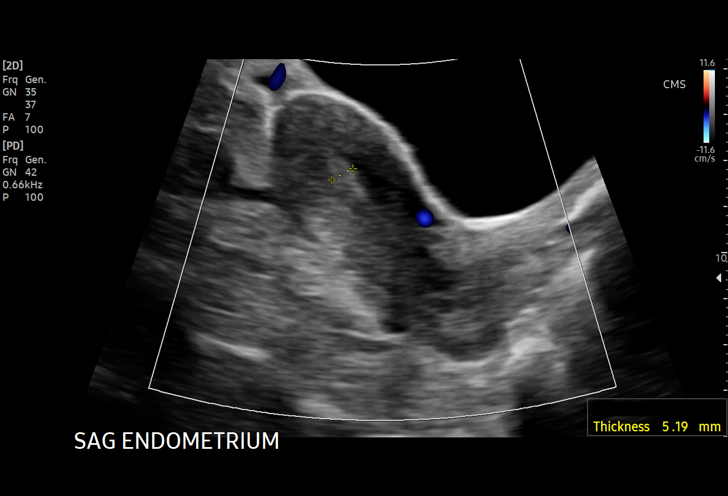
[im 22/22]
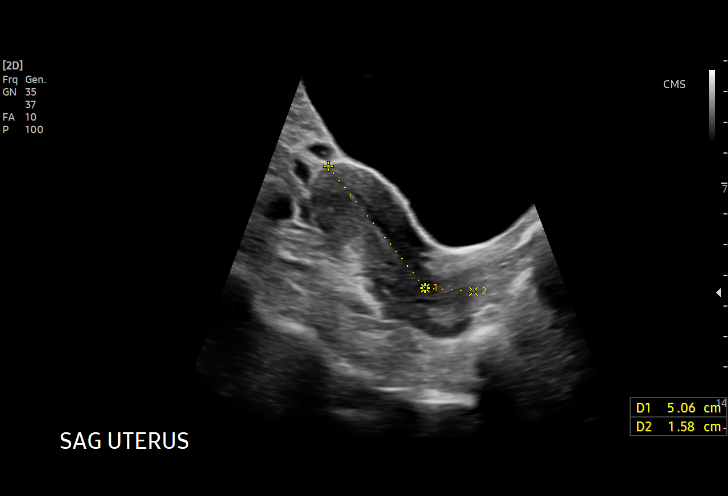

[15 of 22 positions shown; findings below may reference images not displayed]

FINDINGS: Uterus

Measurements: 7.1 x 2.6 x 4.4 cm = volume: 42.5 mL. Uterus is
anteverted. No discrete fibroid or other mass.

Endometrium

Thickness: 5.2 mm.  No focal abnormality visualized.

Right ovary

Measurements: 3.5 x 1.9 x 2.6 cm = volume: 9.0 mL. Normal
appearance/no adnexal mass.

Left ovary

Measurements: 3.1 x 2.2 x 2.2 cm = volume: 8.1 mL. Normal
appearance/no adnexal mass.

Other findings:  No abnormal free fluid.
IMPRESSION: Normal pelvic ultrasound. No acute abnormality or findings to
explain patient's symptoms identified.
